# Patient Record
Sex: Male | Born: 1970 | Race: White | Hispanic: No | Marital: Married | Smoking: Never smoker
Health system: Southern US, Community
[De-identification: ages and names within clinical notes are randomized; demographics above are authoritative.]

## PROBLEM LIST (undated history)

## (undated) HISTORY — PX: LIPOMA EXCISION: SHX5283

---

## 2012-07-27 ENCOUNTER — Encounter: Payer: Self-pay | Admitting: *Deleted

## 2012-07-27 ENCOUNTER — Emergency Department
Admission: EM | Admit: 2012-07-27 | Discharge: 2012-07-27 | Disposition: A | Discharge status: Home / Self Care | Payer: 59 | Source: Home / Self Care

## 2012-07-27 DIAGNOSIS — H109 Unspecified conjunctivitis: Secondary | ICD-10-CM

## 2012-07-27 MED ORDER — TETANUS-DIPHTH-ACELL PERTUSSIS 5-2.5-18.5 LF-MCG/0.5 IM SUSP
0.5000 mL | Freq: Once | INTRAMUSCULAR | Status: AC
  Administered 2012-07-27: 0.5 mL via INTRAMUSCULAR

## 2012-07-27 MED ORDER — POLYMYXIN B-TRIMETHOPRIM 10000-0.1 UNIT/ML-% OP SOLN: 1.0000 [drp] | OPHTHALMIC | Status: DC
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
## 2012-07-27 NOTE — ED Notes (Signed)
Patient was working on his car 2 days ago, getting rust in his left eye. He denies pain or itching, white of his eye is red. He is unsure but believes his last TDaP was in 2007

## 2013-01-05 ENCOUNTER — Emergency Department
Admission: EM | Admit: 2013-01-05 | Discharge: 2013-01-05 | Disposition: A | Discharge status: Home / Self Care | Payer: Self-pay | Source: Home / Self Care | Attending: Family Medicine | Admitting: Family Medicine

## 2013-01-05 ENCOUNTER — Encounter: Payer: Self-pay | Admitting: Emergency Medicine

## 2013-01-05 DIAGNOSIS — R5383 Other fatigue: Secondary | ICD-10-CM

## 2013-01-05 DIAGNOSIS — R42 Dizziness and giddiness: Secondary | ICD-10-CM

## 2013-01-05 LAB — POCT CBC W AUTO DIFF (K'VILLE URGENT CARE)

## 2013-01-05 MED ORDER — MECLIZINE HCL 25 MG PO TABS: 25.0000 mg | ORAL_TABLET | Freq: Three times a day (TID) | ORAL | Status: DC | PRN

## 2013-01-05 MED ORDER — KETOROLAC TROMETHAMINE 30 MG/ML IJ SOLN: 30.0000 mg | Freq: Once | INTRAMUSCULAR | Status: DC

## 2013-01-05 NOTE — ED Provider Notes (Signed)
History     CSN: [id number]  Arrival date & time 01/05/13  7829   First MD Initiated Contact with Patient 01/05/13 [phone]      Chief Complaint  Patient presents with  . Dizziness    HPI Patient presents today with chief complaint of dizziness and fatigue. Patient states she's had intermittent episodes of dizziness and fatigue over the past one to 2 weeks. 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 This schedule has been present for the last 1-2 years.  Patient has a baseline history of migraines. Patient states he has a mild migrainous symptoms although they're not classic for his previous flares. Has had mild frontal/bitemporal headache. Patient denies any hemiparesis or confusion. No chest pain or shortness of breath. Has had some mild intermittent blurry vision that are consistent with his previous migraines. No nausea. Dizziness is intermittent in nature and seem to be worse at the end of his shift. Has some vertiginous components as he feels that the room spinning with these episodes. Symptoms do not seem to be worse with going from sitting to standing.   History reviewed. No pertinent past medical history.  History reviewed. No pertinent past surgical history.  Family History  Problem Relation Age of Onset  . Cancer Mother     breast    History  Substance Use Topics  . Smoking status: Never Smoker   . Smokeless tobacco: Not on file  . Alcohol Use: No      Review of Systems  All other systems reviewed and are negative.    Allergies  Other  Home Medications   Current Outpatient Rx  Name  Route  Sig  Dispense  Refill  . trimethoprim-polymyxin b (POLYTRIM) ophthalmic solution   Left Eye   Place 1 drop into the left eye every 4 (four) hours.   10 mL   0     BP 127/75  Pulse 68  Temp(Src) 97.5 F (36.4 C) (Oral)  Ht 5\' 11"  (1.803 m)  Wt 175 lb  (79.379 kg)  BMI 24.42 kg/m2  SpO2 99%  Physical Exam  Constitutional: He is oriented to person, place, and time. He appears well-developed and well-nourished.  HENT:  Head: Normocephalic and atraumatic.  Right Ear: External ear normal.  Left Ear: External ear normal.  Mouth/Throat: Oropharynx is clear and moist.  Eyes: Conjunctivae are normal. Pupils are equal, round, and reactive to light.  Neck: Normal range of motion.  Cardiovascular: Normal rate, regular rhythm and normal heart sounds.   Pulmonary/Chest: Effort normal and breath sounds normal.  Abdominal: Soft.  Musculoskeletal: Normal range of motion.  Neurological: He is alert and oriented to person, place, and time. He displays normal reflexes. No cranial nerve deficit. He exhibits normal muscle tone. Coordination normal.  Skin: Skin is warm.    ED Course  Procedures (including critical care time)  Labs Reviewed  POCT FASTING CBG [facility] MANUAL ENTRY  POCT CBC W AUTO DIFF ([facility])   No results found.   1. Dizziness   2. Vertigo   3. Fatigue       MDM  Suspect symptoms are likely multifactorial with contributions of fatigue, secondary vertigo. EKG, orthostatics, blood sugar, CBC all within normal limits. Discussed with patient importance of sleep. Offered Toradol for mild headache. Patient declined. Will place patient on meclizine for symptomatic management of vertigo.  Discussed neurologic red flags at length with patient. Followup as needed.     The patient and/or caregiver has been counseled thoroughly with regard to treatment plan and/or medications prescribed including dosage, schedule, interactions, rationale for use, and possible side effects and they verbalize understanding. Diagnoses and expected course of recovery discussed and will return if not improved as expected or if the condition worsens. Patient and/or caregiver verbalized understanding.             Doree Albee,  MD 01/05/13 [phone]

## 2013-01-05 NOTE — ED Notes (Signed)
Dizziness x 1 week

## 2013-01-07 ENCOUNTER — Telehealth: Payer: Self-pay | Admitting: Emergency Medicine

## 2014-02-24 ENCOUNTER — Encounter: Payer: Self-pay | Admitting: Emergency Medicine

## 2014-02-24 ENCOUNTER — Emergency Department
Admission: EM | Admit: 2014-02-24 | Discharge: 2014-02-24 | Disposition: A | Discharge status: Home / Self Care | Payer: 59 | Source: Home / Self Care | Attending: Family Medicine | Admitting: Family Medicine

## 2014-02-24 DIAGNOSIS — J069 Acute upper respiratory infection, unspecified: Secondary | ICD-10-CM

## 2014-02-24 DIAGNOSIS — R1031 Right lower quadrant pain: Secondary | ICD-10-CM

## 2014-02-24 LAB — POCT CBC W AUTO DIFF (K'VILLE URGENT CARE)

## 2014-02-24 LAB — POCT URINALYSIS DIP (MANUAL ENTRY)
Bilirubin, UA: NEGATIVE
Glucose, UA: NEGATIVE
Leukocytes, UA: NEGATIVE
Nitrite, UA: NEGATIVE
PROTEIN UA: NEGATIVE
RBC UA: NEGATIVE
SPEC GRAV UA: 1.02 (ref 1.005–1.03)
UROBILINOGEN UA: 1 (ref 0–1)
pH, UA: 6.5 (ref 5–8)

## 2014-02-24 MED ORDER — FLUTICASONE PROPIONATE 50 MCG/ACT NA SUSP: NASAL | Status: DC

## 2014-02-24 MED ORDER — BENZONATATE 200 MG PO CAPS: 200.0000 mg | ORAL_CAPSULE | Freq: Every day | ORAL | Status: DC

## 2014-02-24 MED ORDER — AMOXICILLIN 875 MG PO TABS: 875.0000 mg | ORAL_TABLET | Freq: Two times a day (BID) | ORAL | Status: DC

## 2014-02-24 NOTE — Discharge Instructions (Signed)
Take plain Mucinex (1200 mg guaifenesin) twice daily for cough and congestion.  May add Sudafed for sinus congestion.   Increase fluid intake, rest. May use Afrin nasal spray (or generic oxymetazoline) twice daily for about 5 days.  Also recommend using saline nasal spray several times daily and saline nasal irrigation (AYR is a common brand).  Use Flonase spray after Afrin and saline irrigation. Stop all antihistamines for now, and other non-prescription cough/cold preparations. May take Ibuprofen 200mg , 4 tabs every 8 hours with food for headache, body aches, etc. If symptoms become significantly worse during the night or over the weekend, proceed to the local emergency room.

## 2014-02-24 NOTE — ED Provider Notes (Signed)
CSN: [id number]     Arrival date & time 02/24/14  1223 History   First MD Initiated Contact with Patient 02/24/14 1237     Chief Complaint  Patient presents with  . Abdominal Pain  . Nasal Congestion  . Hoarse      HPI Comments: Patient complains of 3 day history of sinus congestion, hoarseness, and post-nasal drainage but no sore throat.  He has developed a rare cough.  No fevers, chills, and sweats. He also complains of soreness in his right lower quadrant, worse with abdominal movement.  He admits that he began a new workout program about two weeks ago including leg lifts and abdominal crunches.  No urinary symptoms.  Bowel movements have been normal.  The history is provided by the patient.    History reviewed. No pertinent past medical history. Past Surgical History  Procedure Laterality Date  . Lipoma excision Left     chest   Family History  Problem Relation Age of Onset  . Cancer Mother     breast  . Hypertension Father    History  Substance Use Topics  . Smoking status: Never Smoker   . Smokeless tobacco: Not on file  . Alcohol Use: No    Review of Systems No sore throat + minimal cough No pleuritic pain No wheezing + nasal congestion + post-nasal drainage + sinus pain/pressure No itchy/red eyes No earache No hemoptysis No SOB No fever/chills + nausea No vomiting + abdominal pain No diarrhea No urinary symptoms No skin rash + fatigue + myalgias No headache Used OTC meds without relief  Allergies  Other  Home Medications   Prior to Admission medications   Medication Sig Start Date End Date Taking? Authorizing Provider  amoxicillin (AMOXIL) 875 MG tablet Take 1 tablet (875 mg total) by mouth 2 (two) times daily. 02/24/14   Lattie HawStephen A Joeangel Jeanpaul, MD  benzonatate (TESSALON) 200 MG capsule Take 1 capsule (200 mg total) by mouth at bedtime. Take as needed for cough 02/24/14   Lattie HawStephen A Nolyn Eilert, MD  fluticasone [facility](FLONASE) 50 MCG/ACT nasal spray Place two  sprays in each nostril once daily 02/24/14   Lattie HawStephen A Ed Rayson, MD  meclizine (ANTIVERT) 25 MG tablet Take 1 tablet (25 mg total) by mouth 3 (three) times daily as needed. 01/05/13   Doree AlbeeSteven Newton, MD  trimethoprim-polymyxin b (POLYTRIM) ophthalmic solution Place 1 drop into the left eye every 4 (four) hours. 07/27/12   Doree AlbeeSteven Newton, MD   BP 128/76  Pulse 86  Temp(Src) 98.3 F (36.8 C) (Oral)  Resp 18  Ht 5\' 11"  (1.803 m)  Wt 167 lb (75.751 kg)  BMI 23.30 kg/m2  SpO2 98% Physical Exam Nursing notes and Vital Signs reviewed. Appearance:  Patient appears healthy, stated age, and in no acute distress Eyes:  Pupils are equal, round, and reactive to light and accomodation.  Extraocular movement is intact.  Conjunctivae are not inflamed  Ears:  Canals normal.  Tympanic membranes normal.  Nose:  Mildly congested turbinates.  No sinus tenderness.   Pharynx:  Normal; moist mucous membranes  Neck:  Supple.  Tender enlarged posterior nodes are palpated bilaterally  Lungs:  Clear to auscultation.  Breath sounds are equal.  Heart:  Regular rate and rhythm without murmurs, rubs, or gallops.  Abdomen:  Vague tenderness right lower quadrant without masses or hepatosplenomegaly.  Bowel sounds are present.  No CVA or flank tenderness.  Negative iliopsoas and obdurator tests. Extremities:  No edema.  No calf tenderness  Skin:  No rash present.   ED Course  Procedures  none    Labs Reviewed  POCT CBC W AUTO DIFF ([facility]):  WBC 13.3; LY 13.2; MO 4.4; GR 82.4; Hgb 15.8; Platelets 202   POCT URINALYSIS DIP (MANUAL ENTRY) KET 80mg /dL; otherwise negative      MDM   1. Right lower quadrant abdominal pain; probably musculoskeletal.  However, note leukocytosis 13.4  2. Acute upper respiratory infections of unspecified site; ?sinusitis    Return in 24 to 48 hours for followup and repeat CBC.  If WBC and/or abdominal pain increases, schedule CT scan abdomen. Take plain Mucinex (1200 mg  guaifenesin) twice daily for cough and congestion.  May add Sudafed for sinus congestion.   Increase fluid intake, rest. May use Afrin nasal spray (or generic oxymetazoline) twice daily for about 5 days.  Also recommend using saline nasal spray several times daily and saline nasal irrigation (AYR is a common brand).  Use Flonase spray after Afrin and saline irrigation. Stop all antihistamines for now, and other non-prescription cough/cold preparations. May take Ibuprofen 200mg , 4 tabs every 8 hours with food for headache, body aches, etc. If symptoms become significantly worse during the night or over the weekend, proceed to the local emergency room.     Lattie HawStephen A Ina Scrivens, MD 02/27/14 1055

## 2014-02-24 NOTE — ED Notes (Signed)
Pt c/o nasal congestion, post nasal drip, and hoarseness x 3 days. Denies fever. He also c/o RLQ abd pain x 3 days. Denies V/D. He does report some nausea which he believes may be from drainage. He did recently start a new exercise routine.

## 2014-02-28 ENCOUNTER — Telehealth: Payer: Self-pay | Admitting: *Deleted

## 2016-04-08 ENCOUNTER — Emergency Department
Admission: EM | Admit: 2016-04-08 | Discharge: 2016-04-08 | Disposition: A | Discharge status: Home / Self Care | Payer: BLUE CROSS/BLUE SHIELD | Source: Home / Self Care | Attending: Family Medicine | Admitting: Family Medicine

## 2016-04-08 ENCOUNTER — Encounter: Payer: Self-pay | Admitting: *Deleted

## 2016-04-08 ENCOUNTER — Emergency Department (INDEPENDENT_AMBULATORY_CARE_PROVIDER_SITE_OTHER): Payer: BLUE CROSS/BLUE SHIELD

## 2016-04-08 DIAGNOSIS — M7582 Other shoulder lesions, left shoulder: Secondary | ICD-10-CM | POA: Diagnosis not present

## 2016-04-08 DIAGNOSIS — M25612 Stiffness of left shoulder, not elsewhere classified: Secondary | ICD-10-CM

## 2016-04-08 DIAGNOSIS — M25512 Pain in left shoulder: Secondary | ICD-10-CM

## 2016-04-08 MED ORDER — PREDNISONE 20 MG PO TABS: ORAL_TABLET | ORAL | 0 refills | Status: AC

## 2016-04-08 MED ORDER — MELOXICAM 7.5 MG PO TABS: 7.5000 mg | ORAL_TABLET | Freq: Every day | ORAL | 0 refills | Status: AC
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
## 2016-04-08 NOTE — Discharge Instructions (Signed)
° °  You have been prescribed 5 days of prednisone, an oral steroid to help with inflammation and itching.  You may start this medication tomorrow with breakfast as it can make it difficult to sleep if taken at night.  Meloxicam (Mobic) is an antiinflammatory to help with pain and inflammation.  Do not take ibuprofen, Advil, Aleve, or any other medications that contain NSAIDs while taking meloxicam as this may cause stomach upset or even ulcers if taken in large amounts for an extended period of time.
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
## 2018-02-12 IMAGING — DX DG SHOULDER 2+V*L*
4 series · 4 of 4 positions shown · non-contrast
Comparison: None.

CLINICAL DATA: Chronic left shoulder pain without known injury.

EXAM:
LEFT SHOULDER - 2+ VIEW

[shoulder grashey]
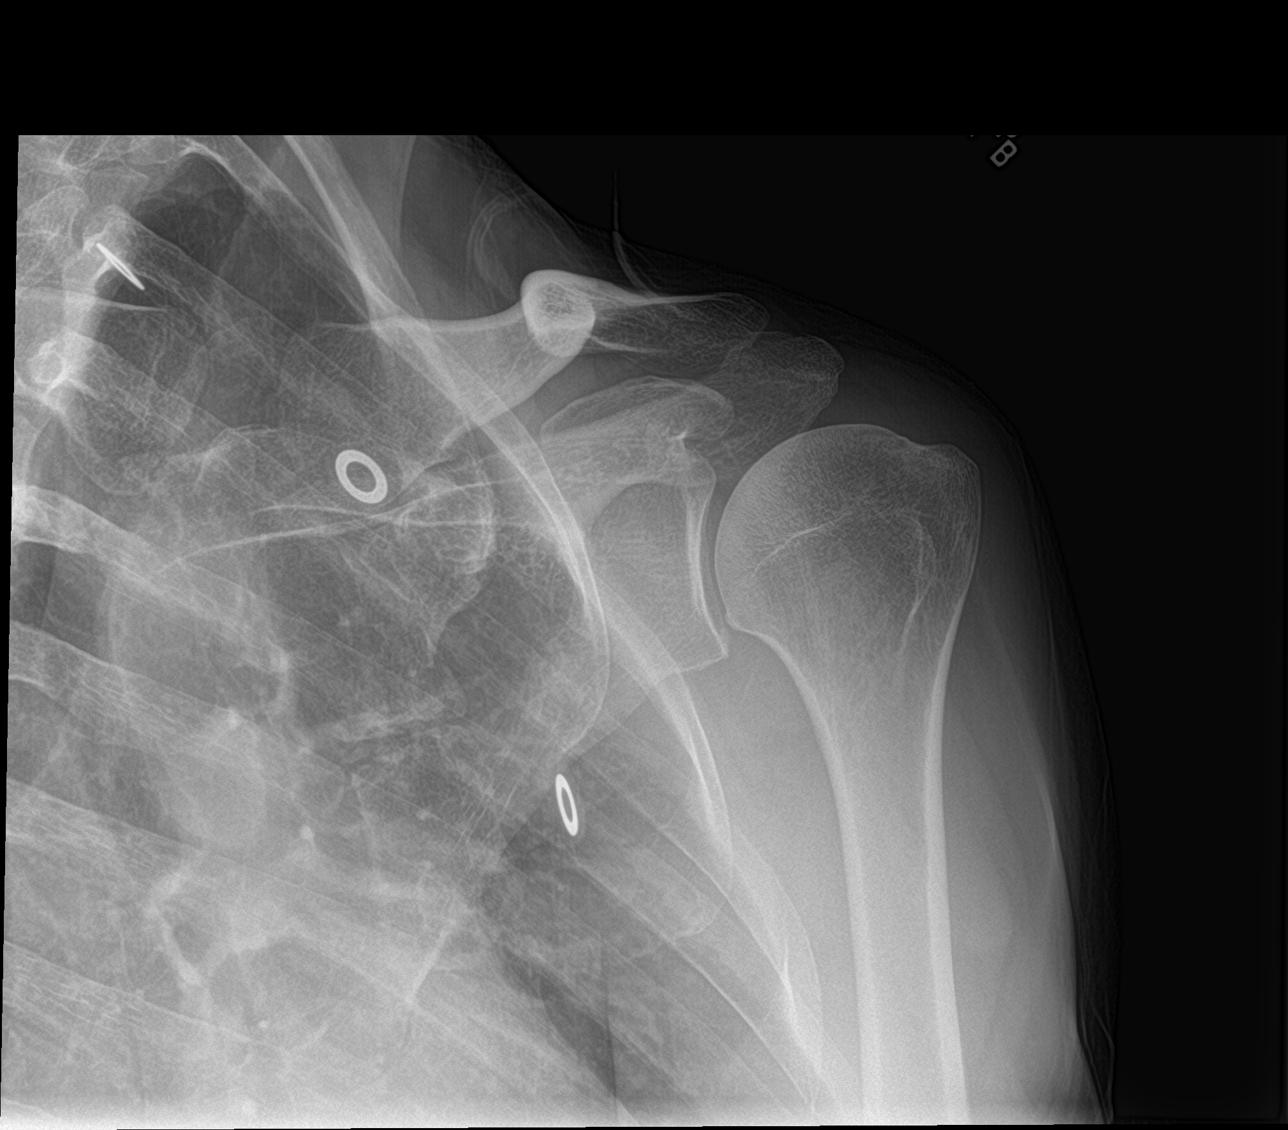

[shoulder y view]
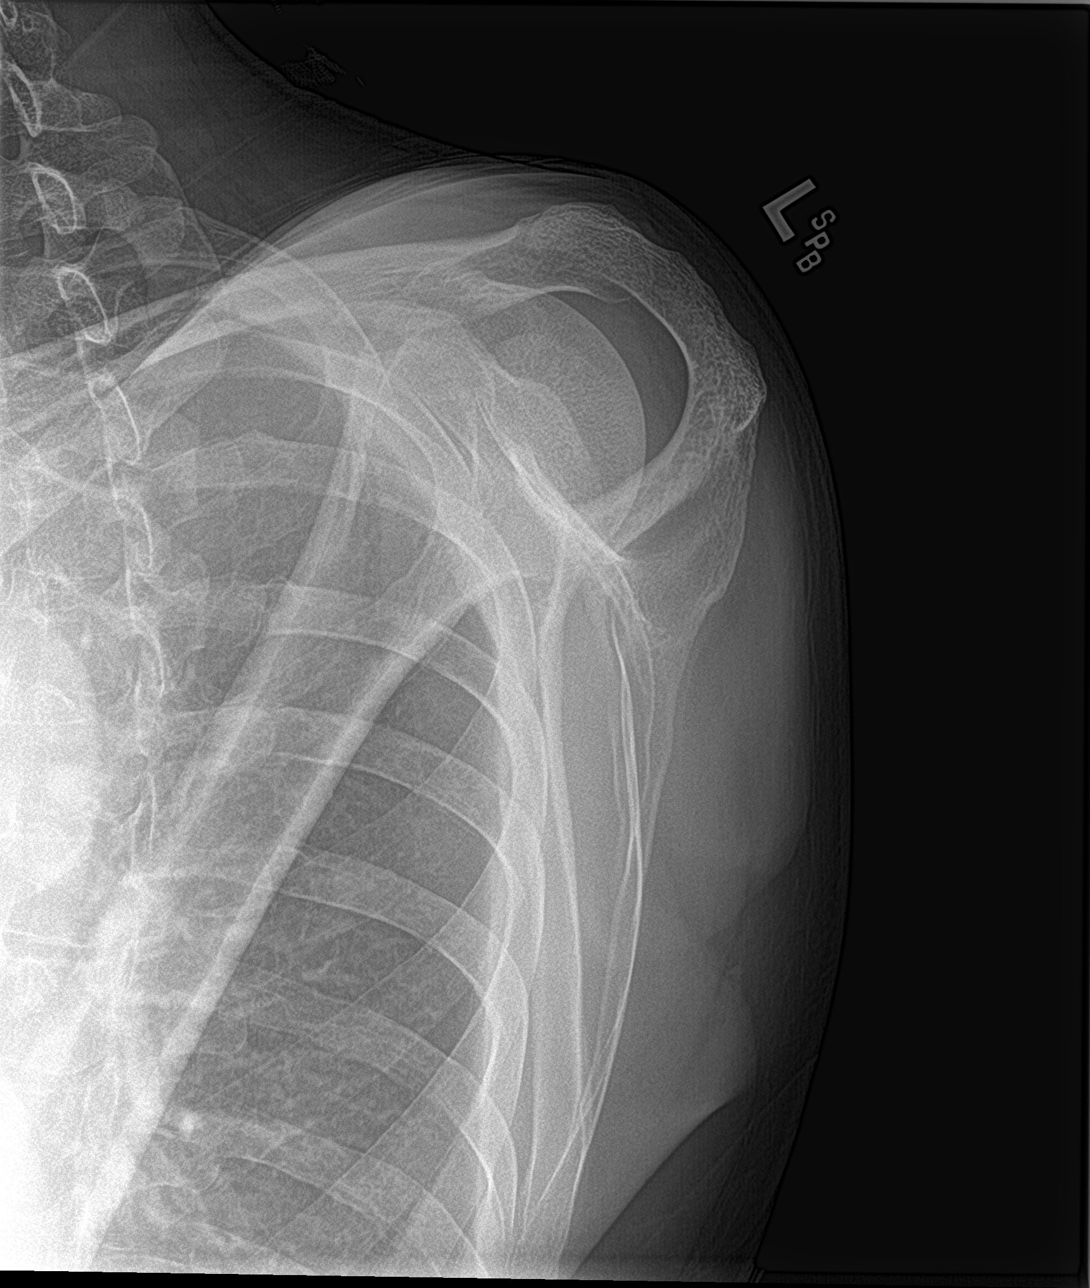

[shoulder axillary (1 of 2)]
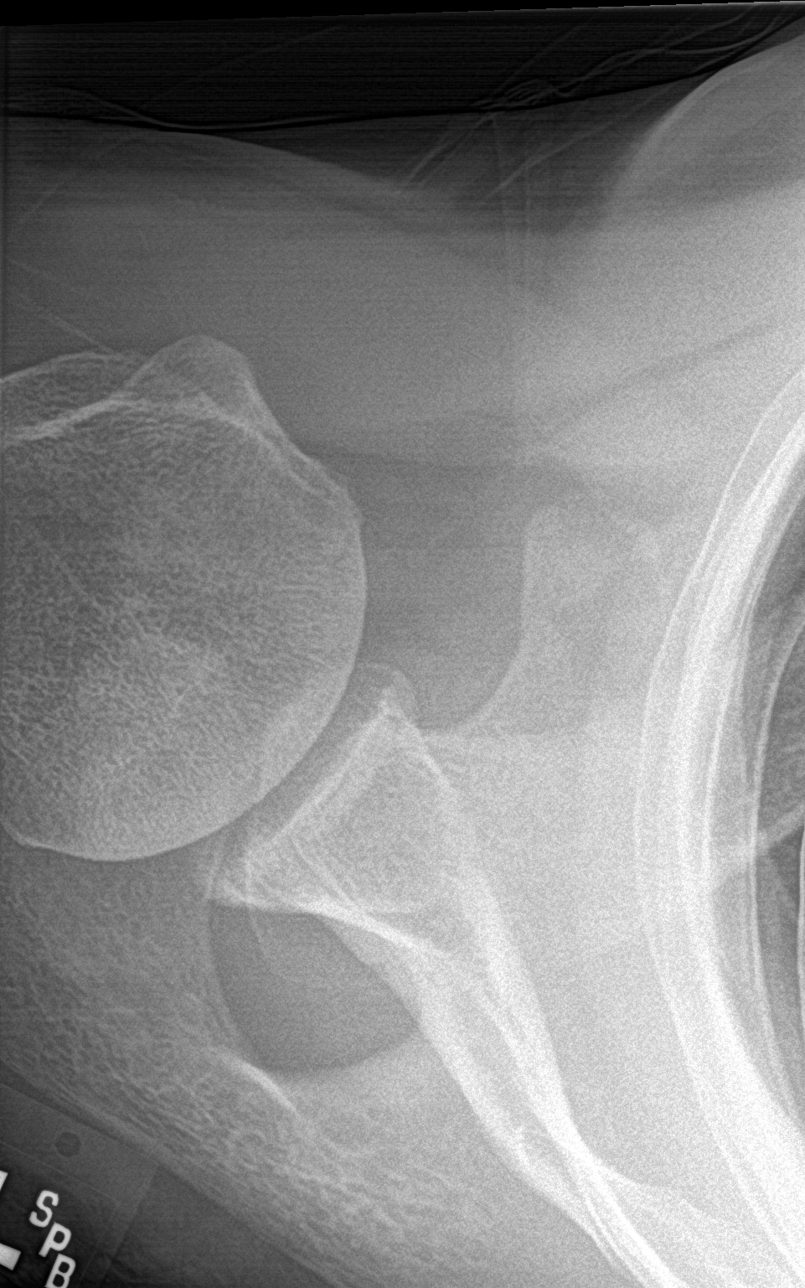

[shoulder axillary (2 of 2)]
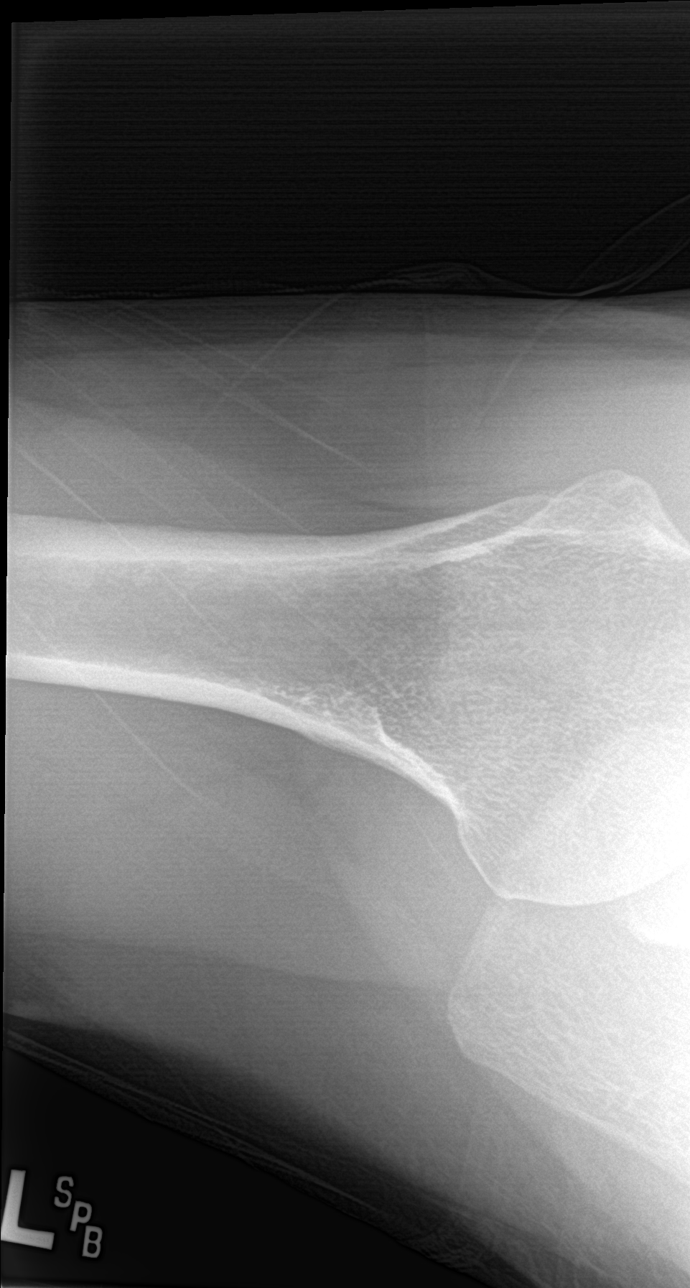

[4 of 4 positions shown; findings below may reference images not displayed]

FINDINGS: There is no evidence of fracture or dislocation. There is no
evidence of arthropathy or other focal bone abnormality. Soft
tissues are unremarkable.
IMPRESSION: Normal left shoulder.
